# Patient Record
Sex: Male | Born: 1980 | Race: Black or African American | Hispanic: No | Marital: Married | Smoking: Current every day smoker
Health system: Southern US, Community
[De-identification: ages and names within clinical notes are randomized; demographics above are authoritative.]

---

## 2005-09-11 ENCOUNTER — Emergency Department: Payer: Self-pay | Admitting: Unknown Physician Specialty

## 2009-05-18 ENCOUNTER — Emergency Department: Payer: Self-pay | Admitting: Unknown Physician Specialty

## 2010-02-08 ENCOUNTER — Emergency Department (HOSPITAL_COMMUNITY): Admission: EM | Admit: 2010-02-08 | Discharge: 2010-02-08 | Payer: Self-pay | Admitting: Family Medicine

## 2012-06-28 ENCOUNTER — Emergency Department: Payer: Self-pay | Admitting: Emergency Medicine

## 2014-02-16 ENCOUNTER — Emergency Department: Payer: Self-pay | Admitting: Emergency Medicine

## 2014-08-28 ENCOUNTER — Emergency Department: Payer: Self-pay | Admitting: Emergency Medicine

## 2014-09-16 ENCOUNTER — Emergency Department
Admit: 2014-09-16 | Disposition: A | Discharge status: Home / Self Care | Payer: Self-pay | Admitting: Emergency Medicine

## 2015-02-01 ENCOUNTER — Emergency Department
Admission: EM | Admit: 2015-02-01 | Discharge: 2015-02-01 | Disposition: A | Discharge status: Home / Self Care | Payer: Self-pay | Attending: Emergency Medicine | Admitting: Emergency Medicine

## 2015-02-01 ENCOUNTER — Emergency Department: Payer: Self-pay

## 2015-02-01 ENCOUNTER — Encounter: Payer: Self-pay | Admitting: Emergency Medicine

## 2015-02-01 DIAGNOSIS — Z720 Tobacco use: Secondary | ICD-10-CM | POA: Insufficient documentation

## 2015-02-01 DIAGNOSIS — M79672 Pain in left foot: Secondary | ICD-10-CM | POA: Insufficient documentation

## 2015-02-01 MED ORDER — HYDROCODONE-ACETAMINOPHEN 5-325 MG PO TABS: 1.0000 | ORAL_TABLET | ORAL | Status: DC | PRN

## 2015-02-01 MED ORDER — IBUPROFEN 800 MG PO TABS: 800.0000 mg | ORAL_TABLET | Freq: Three times a day (TID) | ORAL | Status: DC | PRN

## 2015-02-01 NOTE — ED Provider Notes (Signed)
[facility] Emergency Department Provider Note  ____________________________________________  Time seen: Approximately 3:18 PM  I have reviewed the triage vital signs and the nursing notes.   HISTORY  Chief Complaint Foot Pain    HPI BRAVE DACK is a 34 y.o. male for evaluation of left foot pain states that he was dancing came down hard on the foot.Planes of pain to the heel only.  History reviewed. No pertinent past medical history.  There are no active problems to display for this patient.   History reviewed. No pertinent past surgical history.  Current Outpatient Rx  Name  Route  Sig  Dispense  Refill  . HYDROcodone-acetaminophen (NORCO) 5-325 MG per tablet   Oral   Take 1-2 tablets by mouth every 4 (four) hours as needed for moderate pain.   6 tablet   0   . ibuprofen (ADVIL,MOTRIN) 800 MG tablet   Oral   Take 1 tablet (800 mg total) by mouth every 8 (eight) hours as needed.   30 tablet   0     Allergies Review of patient's allergies indicates no known allergies.  History reviewed. No pertinent family history.  Social History Social History  Substance Use Topics  . Smoking status: Current Some Day Smoker  . Smokeless tobacco: None  . Alcohol Use: Yes    Review of Systems Constitutional: No fever/chills Eyes: No visual changes. ENT: No sore throat. Cardiovascular: Denies chest pain. Respiratory: Denies shortness of breath. Gastrointestinal: No abdominal pain.  No nausea, no vomiting.  No diarrhea.  No constipation. Genitourinary: Negative for dysuria. Musculoskeletal: As a for left calcaneal pain Skin: Negative for rash. Neurological: Negative for headaches, focal weakness or numbness.  10-point ROS otherwise negative.  ____________________________________________   PHYSICAL EXAM:  VITAL SIGNS: ED Triage Vitals  Enc Vitals Group     BP 02/01/15 1510 120/83 mmHg     Pulse Rate 02/01/15 1510 104     Resp 02/01/15  1510 20     Temp 02/01/15 1510 98.2 F (36.8 C)     Temp Source 02/01/15 1510 Oral     SpO2 02/01/15 1510 99 %     Weight 02/01/15 1510 130 lb (58.968 kg)     Height 02/01/15 1510  (1.778 m)     Head Cir --      Peak Flow --      Pain Score 02/01/15 1511 7     Pain Loc --      Pain Edu? --      Excl. in GC? --     Constitutional: Alert and oriented. Well appearing and in no acute distress. Musculoskeletal: Left calcaneal pain. No erythema edema or ecchymosis noted. Achilles tendon intact. Neurologic:  Normal speech and language. No gross focal neurologic deficits are appreciated. No gait instability. Skin:  Skin is warm, dry and intact. No rash noted. Psychiatric: Mood and affect are normal. Speech and behavior are normal.  ____________________________________________   LABS (all labs ordered are listed, but only abnormal results are displayed)  Labs Reviewed - No data to display   RADIOLOGY  Negative for fracture ____________________________________________   PROCEDURES  Procedure(s) performed: None  Critical Care performed: No  ____________________________________________   INITIAL IMPRESSION / ASSESSMENT AND PLAN / ED COURSE  Pertinent labs & imaging results that were available during my care of the patient were reviewed by me and considered in my medical decision making (see chart for details).  Acute left foot contusion. Rx given  for Motrin 800 mg 3 times a day and hydrocodone No. 8 as needed for severe pain. Work note given for today. Patient discharged home in no other emergency medical complaints at this time ____________________________________________   FINAL CLINICAL IMPRESSION(S) / ED DIAGNOSES  Final diagnoses:  Foot pain, left      Evangeline Dakin, PA-C 02/01/15 1610  Phineas Semen, MD 02/01/15 [phone]

## 2015-02-01 NOTE — Discharge Instructions (Signed)
Ice and elevation for the next 24 hours.

## 2015-02-01 NOTE — ED Notes (Signed)
Patient to ED with c/o left foot pain, patient reports he was dancing and came down hard on that foot.

## 2015-02-27 ENCOUNTER — Emergency Department
Admission: EM | Admit: 2015-02-27 | Discharge: 2015-02-27 | Disposition: A | Discharge status: Home / Self Care | Payer: Self-pay | Attending: Emergency Medicine | Admitting: Emergency Medicine

## 2015-02-27 ENCOUNTER — Encounter: Payer: Self-pay | Admitting: Emergency Medicine

## 2015-02-27 DIAGNOSIS — Z720 Tobacco use: Secondary | ICD-10-CM | POA: Insufficient documentation

## 2015-02-27 DIAGNOSIS — M722 Plantar fascial fibromatosis: Secondary | ICD-10-CM | POA: Insufficient documentation

## 2015-02-27 MED ORDER — IBUPROFEN 800 MG PO TABS: 800.0000 mg | ORAL_TABLET | Freq: Three times a day (TID) | ORAL | Status: AC | PRN

## 2015-02-27 NOTE — Discharge Instructions (Signed)
Plantar Fasciitis  Plantar fasciitis is a common condition that causes foot pain. It is soreness (inflammation) of the band of tough fibrous tissue on the bottom of the foot that runs from the heel bone (calcaneus) to the ball of the foot. The cause of this soreness may be from excessive standing, poor fitting shoes, running on hard surfaces, being overweight, having an abnormal walk, or overuse (this is common in runners) of the painful foot or feet. It is also common in aerobic exercise dancers and ballet dancers.  SYMPTOMS   Most people with plantar fasciitis complain of:   Severe pain in the morning on the bottom of their foot especially when taking the first steps out of bed. This pain recedes after a few minutes of walking.   Severe pain is experienced also during walking following a long period of inactivity.   Pain is worse when walking barefoot or up stairs  DIAGNOSIS    Your caregiver will diagnose this condition by examining and feeling your foot.   Special tests such as X-rays of your foot, are usually not needed.  PREVENTION    Consult a sports medicine professional before beginning a new exercise program.   Walking programs offer a good workout. With walking there is a lower chance of overuse injuries common to runners. There is less impact and less jarring of the joints.   Begin all new exercise programs slowly. If problems or pain develop, decrease the amount of time or distance until you are at a comfortable level.   Wear good shoes and replace them regularly.   Stretch your foot and the heel cords at the back of the ankle (Achilles tendon) both before and after exercise.   Run or exercise on even surfaces that are not hard. For example, asphalt is better than pavement.   Do not run barefoot on hard surfaces.   If using a treadmill, vary the incline.   Do not continue to workout if you have foot or joint problems. Seek professional help if they do not improve.  HOME CARE INSTRUCTIONS     Avoid activities that cause you pain until you recover.   Use ice or cold packs on the problem or painful areas after working out.   Only take over-the-counter or prescription medicines for pain, discomfort, or fever as directed by your caregiver.   Soft shoe inserts or athletic shoes with air or gel sole cushions may be helpful.   If problems continue or become more severe, consult a sports medicine caregiver or your own health care provider. Cortisone is a potent anti-inflammatory medication that may be injected into the painful area. You can discuss this treatment with your caregiver.  MAKE SURE YOU:    Understand these instructions.   Will watch your condition.   Will get help right away if you are not doing well or get worse.  Document Released: 02/26/2001 Document Revised: 08/26/2011 Document Reviewed: 04/27/2008  ExitCare Patient Information 2015 [employer]. This information is not intended to replace advice given to you by your health care provider. Make sure you discuss any questions you have with your health care provider.

## 2015-02-27 NOTE — ED Provider Notes (Signed)
[facility] Emergency Department Provider Note  ____________________________________________  Time seen: Approximately 10:35 AM  I have reviewed the triage vital signs and the nursing notes.   HISTORY  Chief Complaint Foot Pain    HPI Carlos Evans is a 34 y.o. male presents for evaluation of left foot pain but states it better. States he did not go to work Thursday or Friday of last week secondary to the pain which is improved today.   History reviewed. No pertinent past medical history.  There are no active problems to display for this patient.   History reviewed. No pertinent past surgical history.  Current Outpatient Rx  Name  Route  Sig  Dispense  Refill  . ibuprofen (ADVIL,MOTRIN) 800 MG tablet   Oral   Take 1 tablet (800 mg total) by mouth every 8 (eight) hours as needed.   30 tablet   0     Allergies Review of patient's allergies indicates no known allergies.  History reviewed. No pertinent family history.  Social History Social History  Substance Use Topics  . Smoking status: Current Some Day Smoker  . Smokeless tobacco: None  . Alcohol Use: Yes    Review of Systems Constitutional: No fever/chills Eyes: No visual changes. ENT: No sore throat. Cardiovascular: Denies chest pain. Respiratory: Denies shortness of breath. Gastrointestinal: No abdominal pain.  No nausea, no vomiting.  No diarrhea.  No constipation. Genitourinary: Negative for dysuria. Musculoskeletal: Negative for back pain. Skin: Negative for rash. Neurological: Negative for headaches, focal weakness or numbness.  10-point ROS otherwise negative.  ____________________________________________   PHYSICAL EXAM:  VITAL SIGNS: ED Triage Vitals  Enc Vitals Group     BP 02/27/15 1006 116/75 mmHg     Pulse Rate 02/27/15 1006 110     Resp 02/27/15 1006 16     Temp 02/27/15 1006 98.4 F (36.9 C)     Temp Source 02/27/15 1006 Oral     SpO2 02/27/15 1006 100  %     Weight 02/27/15 1006 130 lb (58.968 kg)     Height 02/27/15 1006  (1.778 m)     Head Cir --      Peak Flow --      Pain Score 02/27/15 1007 2     Pain Loc --      Pain Edu? --      Excl. in GC? --    Constitutional: Alert and oriented. Well appearing and in no acute distress.  Musculoskeletal: No lower extremity tenderness nor edema.  No joint effusions. Neurologic:  Normal speech and language. No gross focal neurologic deficits are appreciated. No gait instability. Skin:  Skin is warm, dry and intact. No rash noted. Psychiatric: Mood and affect are normal. Speech and behavior are normal.  ____________________________________________   LABS (all labs ordered are listed, but only abnormal results are displayed)  Labs Reviewed - No data to display ____________________________________________  PROCEDURES  Procedure(s) performed: None  Critical Care performed: No  ____________________________________________   INITIAL IMPRESSION / ASSESSMENT AND PLAN / ED COURSE  Pertinent labs & imaging results that were available during my care of the patient were reviewed by me and considered in my medical decision making (see chart for details).  Recurrent left foot pain which is resolving better today. Rx given for Motrin 800 mg 3 times a day #30 reassurance provided patient given work note and he is to follow-up with his PCP or return to the ER with any worsening symptomology.  Patient voices no other emergency medical complaints at this visit. ____________________________________________   FINAL CLINICAL IMPRESSION(S) / ED DIAGNOSES  Final diagnoses:  Plantar fasciitis of left foot      Evangeline Dakin, PA-C 02/27/15 1054  Jennye Moccasin, MD 02/27/15 1209

## 2015-02-27 NOTE — ED Notes (Signed)
States he was seen about 1 month ago for foot pain   .Marland Kitchenstates foot pain is better but was not able to work on Thursday or Friday. Needs a work note

## 2015-02-27 NOTE — ED Notes (Addendum)
Reports left foot pain x 1 month.  Seen then and dx with contusion.  Pt states pain is mostly better.  Ambulates without difficulty to triage.  States he needs a doctors note to go back to work

## 2015-09-20 ENCOUNTER — Encounter: Payer: Self-pay | Admitting: Emergency Medicine

## 2015-09-20 ENCOUNTER — Emergency Department
Admission: EM | Admit: 2015-09-20 | Discharge: 2015-09-20 | Disposition: A | Discharge status: Home / Self Care | Payer: Self-pay | Attending: Emergency Medicine | Admitting: Emergency Medicine

## 2015-09-20 DIAGNOSIS — S30862A Insect bite (nonvenomous) of penis, initial encounter: Secondary | ICD-10-CM | POA: Insufficient documentation

## 2015-09-20 DIAGNOSIS — F17200 Nicotine dependence, unspecified, uncomplicated: Secondary | ICD-10-CM | POA: Insufficient documentation

## 2015-09-20 DIAGNOSIS — Y939 Activity, unspecified: Secondary | ICD-10-CM | POA: Insufficient documentation

## 2015-09-20 DIAGNOSIS — Y929 Unspecified place or not applicable: Secondary | ICD-10-CM | POA: Insufficient documentation

## 2015-09-20 DIAGNOSIS — W57XXXA Bitten or stung by nonvenomous insect and other nonvenomous arthropods, initial encounter: Secondary | ICD-10-CM | POA: Insufficient documentation

## 2015-09-20 DIAGNOSIS — Y999 Unspecified external cause status: Secondary | ICD-10-CM | POA: Insufficient documentation

## 2015-09-20 MED ORDER — SULFAMETHOXAZOLE-TRIMETHOPRIM 800-160 MG PO TABS: 1.0000 | ORAL_TABLET | Freq: Two times a day (BID) | ORAL | Status: DC

## 2015-09-20 MED ORDER — NEOMYCIN-POLYMYXIN-PRAMOXINE 1 % EX CREA: TOPICAL_CREAM | Freq: Two times a day (BID) | CUTANEOUS | Status: DC

## 2015-09-20 MED ORDER — TRAMADOL HCL 50 MG PO TABS: 50.0000 mg | ORAL_TABLET | Freq: Four times a day (QID) | ORAL | Status: AC | PRN

## 2015-09-20 NOTE — ED Notes (Signed)
See triage   Noticed bumps on penis couple of days ago  Denies any urinary sx's

## 2015-09-20 NOTE — ED Notes (Signed)
Patient presents to the ED with painful bumps to the head of patient's penis.  Patient states one of the bumps is oozing puss.  Patient denies fever.  Patient is in no obvious distress at this time.  Patient first noticed area on Sunday.  Patient denies history of the same.

## 2015-09-20 NOTE — ED Provider Notes (Signed)
[facility] Emergency Department Provider Note  ____________________________________________  Time seen: Approximately 4:04 PM  I have reviewed the triage vital signs and the nursing notes.   HISTORY  Chief Complaint Penis Pain    HPI Carlos Evans is a 35 y.o. male patient complain bumps on penis. 3 days. Patient state purulent material from one to the lesions. Patient denies any fevers chills associated this complaint. Patient denies any high risk sexual activities. Patient significant other state that she smashed insect that she notices crawling on his stomach days ago. Patient denies any dysuria or penial discharge. Patient state there is no history of sexually transmitted disease. Patient rates his pain as a 10 over 10. Patient states pain increase because the area is rubbed against his). No palliative measures taken for this complaint..  History reviewed. No pertinent past medical history.  There are no active problems to display for this patient.   No past surgical history on file.  Current Outpatient Rx  Name  Route  Sig  Dispense  Refill  . ibuprofen (ADVIL,MOTRIN) 800 MG tablet   Oral   Take 1 tablet (800 mg total) by mouth every 8 (eight) hours as needed.   30 tablet   0     Allergies Review of patient's allergies indicates no known allergies.  No family history on file.  Social History Social History  Substance Use Topics  . Smoking status: Current Some Day Smoker  . Smokeless tobacco: None  . Alcohol Use: Yes    Review of Systems Constitutional: No fever/chills Eyes: No visual changes. ENT: No sore throat. Cardiovascular: Denies chest pain. Respiratory: Denies shortness of breath. Gastrointestinal: No abdominal pain.  No nausea, no vomiting.  No diarrhea.  No constipation. Genitourinary: Negative for dysuria. Musculoskeletal: Negative for back pain. Skin: Positive for rash. Neurological: Negative for headaches, focal  weakness or numbness.    ____________________________________________   PHYSICAL EXAM:  VITAL SIGNS: ED Triage Vitals  Enc Vitals Group     BP 09/20/15 1523 117/73 mmHg     Pulse Rate 09/20/15 1523 100     Resp 09/20/15 1523 20     Temp 09/20/15 1523 98.4 F (36.9 C)     Temp Source 09/20/15 1523 Oral     SpO2 09/20/15 1523 98 %     Weight 09/20/15 1523 124 lb (56.246 kg)     Height 09/20/15 1523  (1.778 m)     Head Cir --      Peak Flow --      Pain Score 09/20/15 1524 10     Pain Loc --      Pain Edu? --      Excl. in GC? --     Constitutional: Alert and oriented. Well appearing and in no acute distress. Eyes: Conjunctivae are normal. PERRL. EOMI. Head: Atraumatic. Nose: No congestion/rhinnorhea. Mouth/Throat: Mucous membranes are moist.  Oropharynx non-erythematous. Neck: No stridor.  No cervical spine tenderness to palpation. Cardiovascular: Normal rate, regular rhythm. Grossly normal heart sounds.  Good peripheral circulation. Respiratory: Normal respiratory effort.  No retractions. Lungs CTAB. Gastrointestinal: Soft and nontender. No distention. No abdominal bruits. No CVA tenderness. Genitourinary: To ruptured papular lesions meatus of penis. Musculoskeletal: No lower extremity tenderness nor edema.  No joint effusions. Neurologic:  Normal speech and language. No gross focal neurologic deficits are appreciated. No gait instability. Skin:  Skin is warm, dry and intact. No rash noted. Psychiatric: Mood and affect are normal. Speech and behavior  are normal.  ____________________________________________   LABS (all labs ordered are listed, but only abnormal results are displayed)  Labs Reviewed  WOUND CULTURE   ____________________________________________  EKG   ____________________________________________  RADIOLOGY   ____________________________________________   PROCEDURES  Procedure(s) performed: None  Critical Care performed:  No  ____________________________________________   INITIAL IMPRESSION / ASSESSMENT AND PLAN / ED COURSE  Pertinent labs & imaging results that were available during my care of the patient were reviewed by me and considered in my medical decision making (see chart for details).  Infected insect bite penis. Cultures taken of the discharge. Patient given a prescription for Bactrim DS, tramadol, and Neosporin. Patient advised to [facility] if condition does not improve in 5-7 days. ____________________________________________   FINAL CLINICAL IMPRESSION(S) / ED DIAGNOSES  Final diagnoses:  Infected insect bite       Joni ReiningRonald K Smith, PA-C 09/20/15 1610  Emily FilbertJonathan E Williams, MD 09/21/15 1515

## 2015-09-20 NOTE — Discharge Instructions (Signed)
Take medication as directed.

## 2015-09-23 LAB — WOUND CULTURE: SPECIAL REQUESTS: NORMAL

## 2016-04-06 IMAGING — CR DG FOOT COMPLETE 3+V*L*
1 series · 3 of 3 positions shown · non-contrast
Comparison: None.

CLINICAL DATA: Jumping injury, landing on foot wrong. Bruise on
bottom center of foot. Pain around heel.

EXAM:
LEFT FOOT - COMPLETE 3+ VIEW

[Series 1: ap · 0.17mm/px · 3 of 3 slices shown]
[im 1/3]
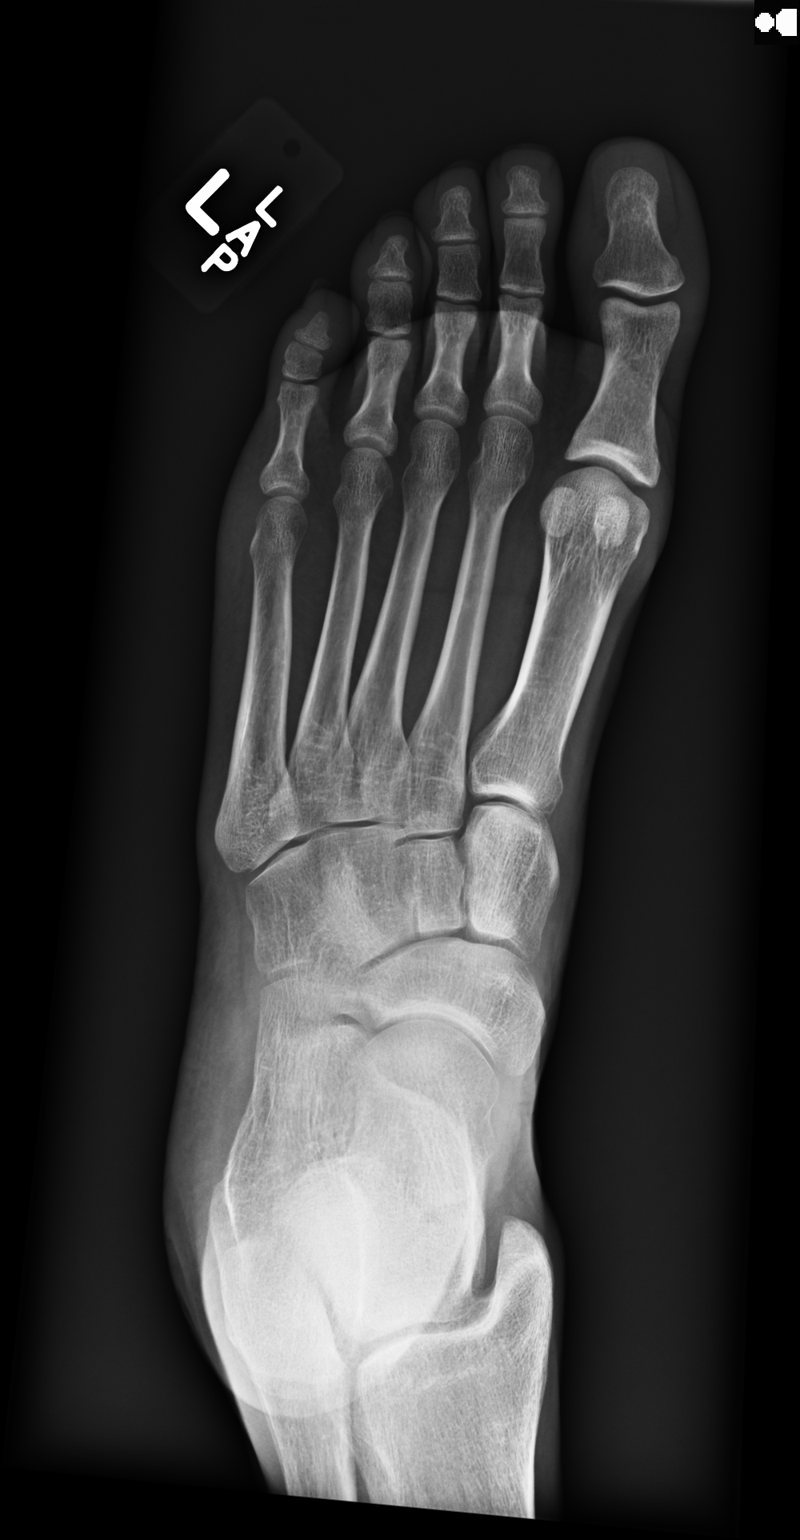
[im 2/3]
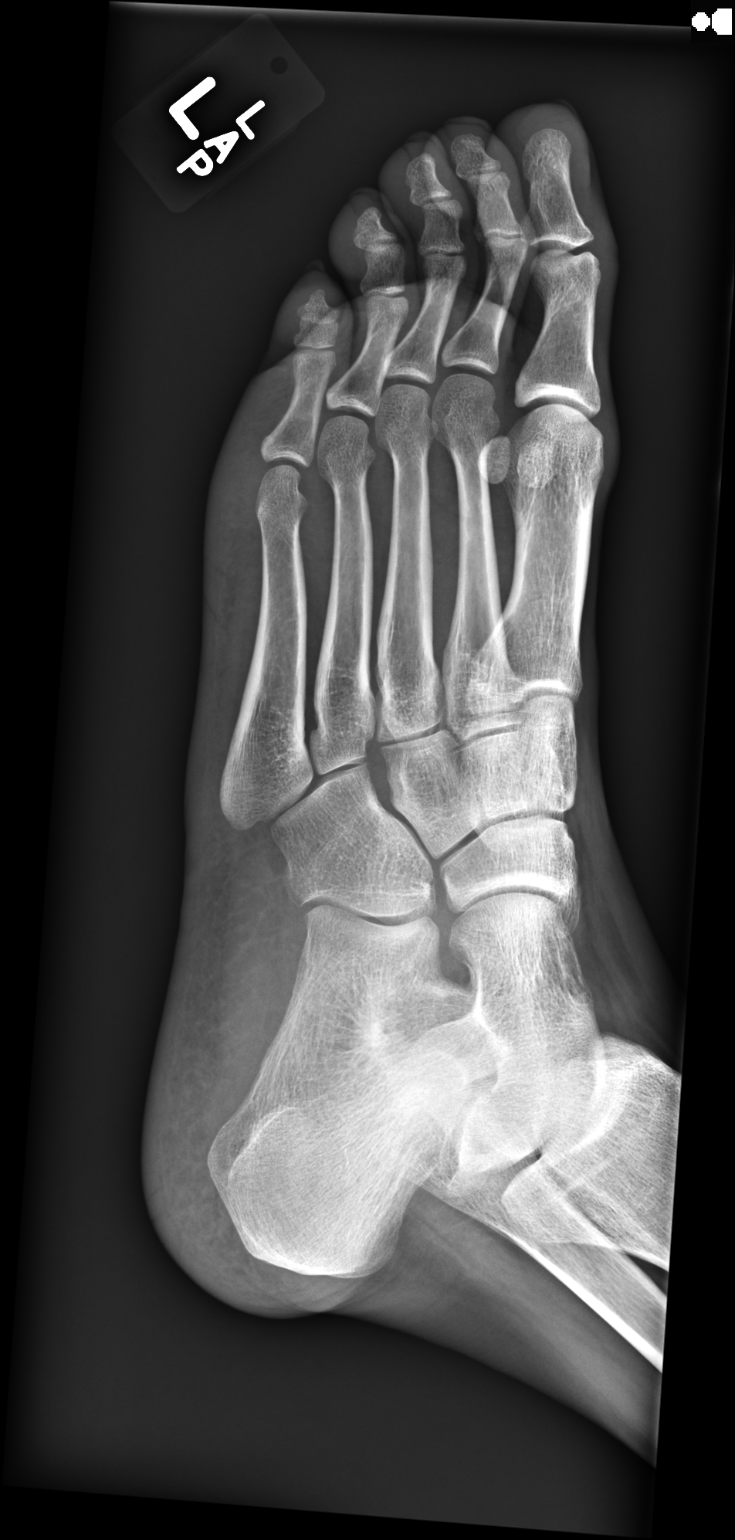
[im 3/3]
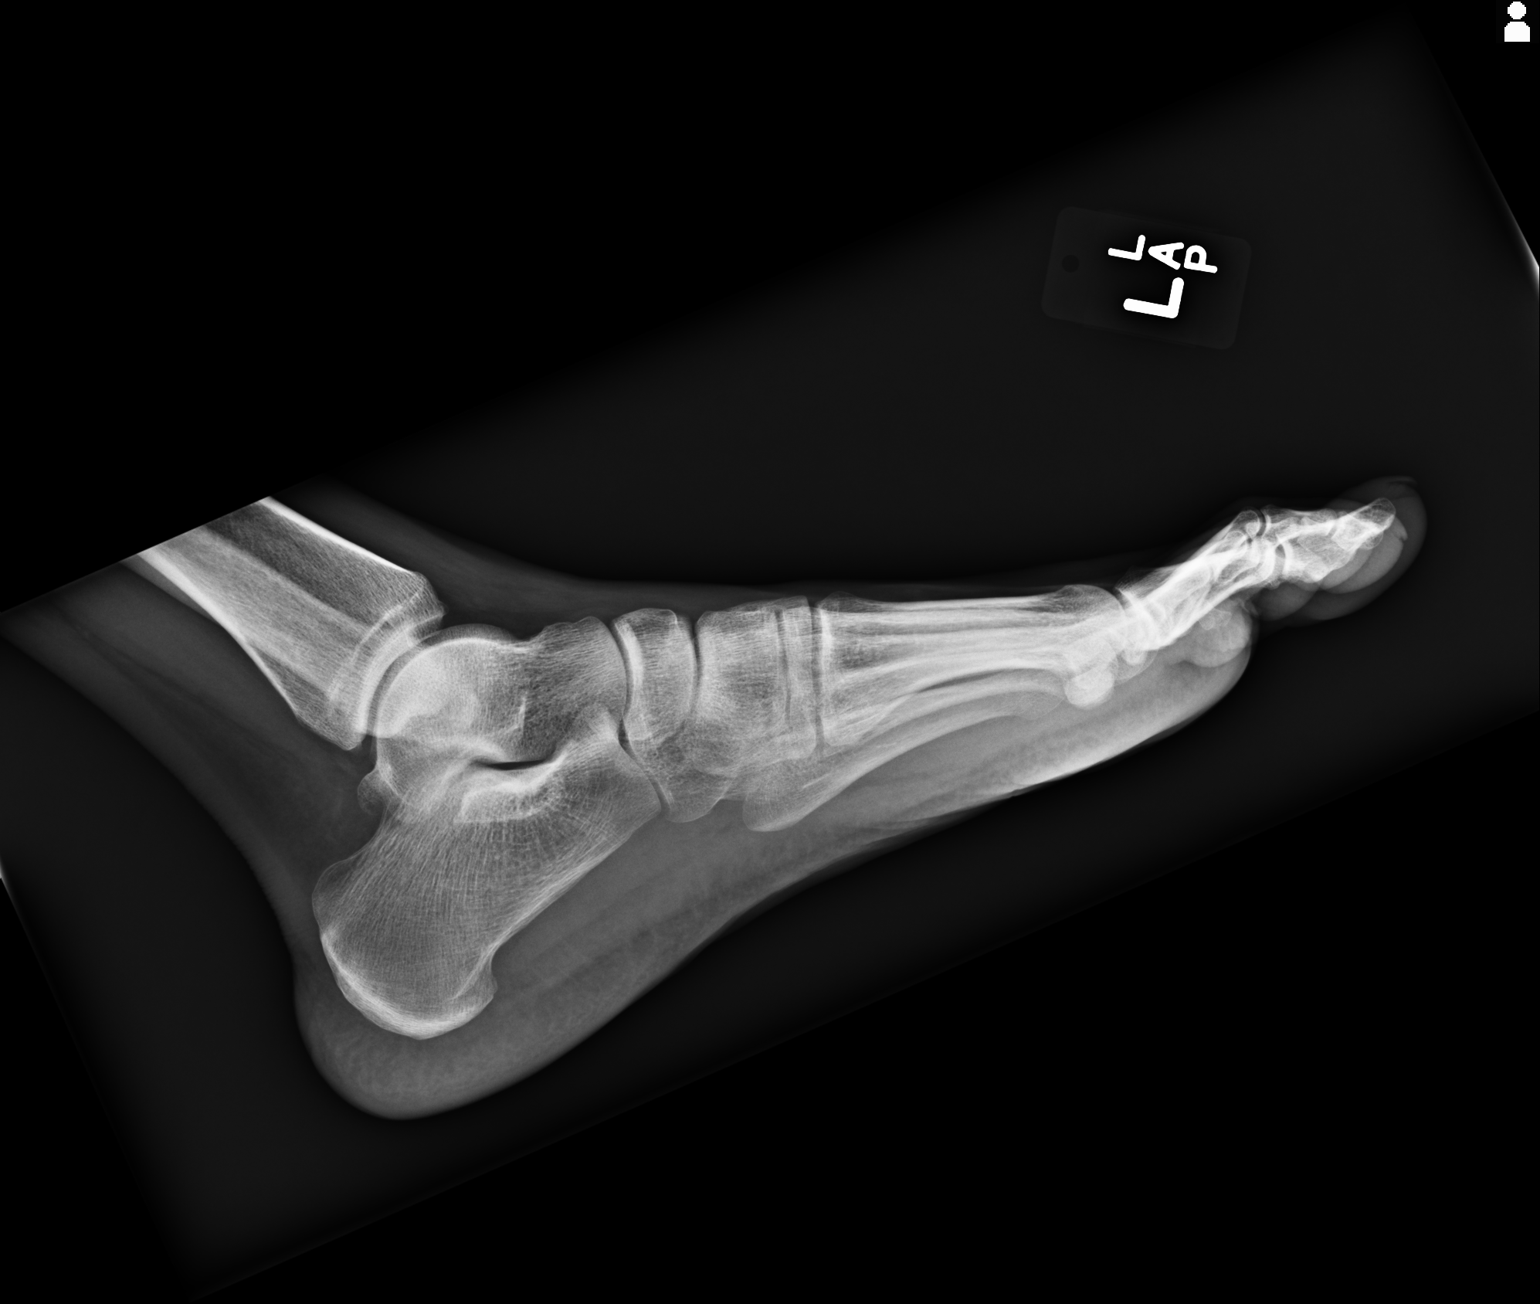

[3 of 3 positions shown; findings below may reference images not displayed]

FINDINGS: There is no evidence of fracture or dislocation. There is no
evidence of arthropathy or other focal bone abnormality. Soft
tissues are unremarkable.
IMPRESSION: Negative.

## 2017-02-07 ENCOUNTER — Emergency Department
Admission: EM | Admit: 2017-02-07 | Discharge: 2017-02-07 | Disposition: A | Discharge status: Home / Self Care | Payer: Self-pay | Attending: Emergency Medicine | Admitting: Emergency Medicine

## 2017-02-07 DIAGNOSIS — S81811A Laceration without foreign body, right lower leg, initial encounter: Secondary | ICD-10-CM | POA: Insufficient documentation

## 2017-02-07 DIAGNOSIS — Y9367 Activity, basketball: Secondary | ICD-10-CM | POA: Insufficient documentation

## 2017-02-07 DIAGNOSIS — F17200 Nicotine dependence, unspecified, uncomplicated: Secondary | ICD-10-CM | POA: Insufficient documentation

## 2017-02-07 DIAGNOSIS — W010XXA Fall on same level from slipping, tripping and stumbling without subsequent striking against object, initial encounter: Secondary | ICD-10-CM | POA: Insufficient documentation

## 2017-02-07 DIAGNOSIS — Y92310 Basketball court as the place of occurrence of the external cause: Secondary | ICD-10-CM | POA: Insufficient documentation

## 2017-02-07 DIAGNOSIS — S81812A Laceration without foreign body, left lower leg, initial encounter: Secondary | ICD-10-CM

## 2017-02-07 DIAGNOSIS — Y999 Unspecified external cause status: Secondary | ICD-10-CM | POA: Insufficient documentation

## 2017-02-07 MED ORDER — NAPROXEN 500 MG PO TABS: 500.0000 mg | ORAL_TABLET | Freq: Two times a day (BID) | ORAL | 0 refills | Status: AC

## 2017-02-07 MED ORDER — BACITRACIN ZINC 500 UNIT/GM EX OINT
TOPICAL_OINTMENT | Freq: Two times a day (BID) | CUTANEOUS | Status: DC
  Administered 2017-02-07: 1 via TOPICAL
  Filled 2017-02-07: qty 0.9

## 2017-02-07 MED ORDER — SULFAMETHOXAZOLE-TRIMETHOPRIM 800-160 MG PO TABS
ORAL_TABLET | ORAL | Status: AC
  Filled 2017-02-07: qty 1

## 2017-02-07 MED ORDER — SULFAMETHOXAZOLE-TRIMETHOPRIM 800-160 MG PO TABS: 1.0000 | ORAL_TABLET | Freq: Two times a day (BID) | ORAL | 0 refills | Status: AC

## 2017-02-07 MED ORDER — SULFAMETHOXAZOLE-TRIMETHOPRIM 800-160 MG PO TABS
1.0000 | ORAL_TABLET | Freq: Once | ORAL | Status: AC
  Administered 2017-02-07: 1 via ORAL

## 2017-02-07 NOTE — ED Triage Notes (Addendum)
Pt presents to ED via POV with c/o laceration to medical aspect of RIGHT knee s/p fall on Wednesday. Pt reports injury occurred while playing basketball. Pt has a 1" long by 1/2" wide laceration, no bleeding at this time. No obvious deformity or dislocation to knee observed, distal CMS intact. Pt is A&O, in NAD; RR even, regular, and unlabored. Skin color/temp is otherwise WNL.

## 2017-02-07 NOTE — ED Notes (Signed)
Cleaned wound with sterile saline, applied bacitracin and sterile bandage per NP order.

## 2017-02-07 NOTE — ED Notes (Signed)
Patient c/o wound/shave laceration to right medial leg, proximal leg that occurred on Wednesday. Patient is tender to palpation of surrounding area, extending approx 4" distal from site of injury, as well as to to posterior knee.

## 2017-02-07 NOTE — Discharge Instructions (Signed)
Return to the ER for symptoms of concern if you are unable to schedule an appointment with your primary care provider. Clean the wound 2 times per day and use antibiotic ointment. Leave it open to air when not at risk of getting dirty. Cover it before bed and before work. Take the antibiotic until finished.

## 2017-02-07 NOTE — ED Provider Notes (Signed)
[facility] Emergency Department Provider Note  ____________________________________________  Time seen: Approximately 11:15 PM  I have reviewed the triage vital signs and the nursing notes.   HISTORY  Chief Complaint Laceration   HPI Carlos Evans is a 36 y.o. male who presents to the emergency department for treatment of an open wound to his rightlower extremity that he sustained while playing basketball 2 days ago. He has not attempted any wound care. The area has become more tender today and caused him to become concerned enough to come to the ER. He denies fever. He has not taken any medications or attempted any alleviating measures with the exception of elevation and rest.  History reviewed. No pertinent past medical history.  There are no active problems to display for this patient.   History reviewed. No pertinent surgical history.  Prior to Admission medications   Medication Sig Start Date End Date Taking? Authorizing Provider  ibuprofen (ADVIL,MOTRIN) 800 MG tablet Take 1 tablet (800 mg total) by mouth every 8 (eight) hours as needed. 02/27/15   Beers, Charmayne Sheer, PA-C  naproxen (NAPROSYN) 500 MG tablet Take 1 tablet (500 mg total) by mouth 2 (two) times daily with a meal. 02/07/17   Zenovia Justman B, FNP  sulfamethoxazole-trimethoprim (BACTRIM DS,SEPTRA DS) 800-160 MG tablet Take 1 tablet by mouth 2 (two) times daily. 02/07/17   Evana Runnels, Rulon Eisenmenger B, FNP  traMADol (ULTRAM) 50 MG tablet Take 1 tablet (50 mg total) by mouth every 6 (six) hours as needed for moderate pain. 09/20/15   Joni Reining, PA-C    Allergies Patient has no known allergies.  No family history on file.  Social History Social History  Substance Use Topics  . Smoking status: Current Some Day Smoker  . Smokeless tobacco: Never Used  . Alcohol use Yes    Review of Systems  Constitutional: Negative  for fever. Respiratory: Negative for cough or shortness of breath.   Musculoskeletal: Positive for myalgias Skin: Positive for laceration. Neurological: Negative for numbness or paresthesias. ____________________________________________   PHYSICAL EXAM:  VITAL SIGNS: ED Triage Vitals  Enc Vitals Group     BP 02/07/17 2006 112/71     Pulse Rate 02/07/17 2006 88     Resp 02/07/17 2006 17     Temp 02/07/17 2006 98.6 F (37 C)     Temp Source 02/07/17 2006 Oral     SpO2 02/07/17 2006 99 %     Weight 02/07/17 2001 145 lb (65.8 kg)     Height 02/07/17 2001 5\' 10"  (1.778 m)     Head Circumference --      Peak Flow --      Pain Score 02/07/17 2001 10     Pain Loc --      Pain Edu? --      Excl. in GC? --      Constitutional: Well appearing. Eyes: Conjunctivae are clear without discharge or drainage. Nose: No rhinorrhea noted. Mouth/Throat: Airway is patent.  Neck: No stridor. Unrestricted range of motion observed.  Cardiovascular: Capillary refill is <3 seconds.  Respiratory: Respirations are even and unlabored.. Musculoskeletal: Unrestricted range of motion observed. Neurologic: Awake, alert, and oriented x 4.  Skin:  2.5cm open lesion with granulation tissue noted to the proximal, medial, right lower extremity just below the knee. Surrounding area of erythema  ____________________________________________   LABS (all labs ordered are listed, but only abnormal results are displayed)  Labs Reviewed - No data to display ____________________________________________  EKG   ____________________________________________  RADIOLOGY  Not indicated. ____________________________________________   PROCEDURES  Procedure(s) performed: None ____________________________________________   INITIAL IMPRESSION / ASSESSMENT AND PLAN / ED COURSE  Carlos Evans is a 36 y.o. male who presents to the emergency department 2 days after falling while playing basketball. Wound cleaned with NS and bacitracin dressing applied by RN. He will be treated  with Bactrim for concern of an early cellulitis. Tetanus is up to date per patient. Symptoms and signs of worsening infection, and wound care were discussed with the patient and family. He is to follow up with his PCP for symptoms that are not improving over the next few days or return to the ER if unable to schedule an appointment.   Pertinent labs & imaging results that were available during my care of the patient were reviewed by me and considered in my medical decision making (see chart for details). ____________________________________________   FINAL CLINICAL IMPRESSION(S) / ED DIAGNOSES  Final diagnoses:  Laceration of left lower extremity, initial encounter    Discharge Medication List as of 02/07/2017  9:11 PM    START taking these medications   Details  naproxen (NAPROSYN) 500 MG tablet Take 1 tablet (500 mg total) by mouth 2 (two) times daily with a meal., Starting Fri 02/07/2017, Print        If controlled substance prescribed during this visit, 12 month history viewed on the [facility] prior to issuing an initial prescription for Schedule II or III opiod.   Note:  This document was prepared using Dragon voice recognition software and may include unintentional dictation errors.    Chinita Pester, FNP 02/07/17 7517    Loleta Rose, MD 02/08/17 [phone]

## 2017-08-24 ENCOUNTER — Emergency Department
Admission: EM | Admit: 2017-08-24 | Discharge: 2017-08-24 | Disposition: A | Discharge status: Home / Self Care | Payer: Self-pay | Attending: Emergency Medicine | Admitting: Emergency Medicine

## 2017-08-24 ENCOUNTER — Other Ambulatory Visit: Payer: Self-pay

## 2017-08-24 ENCOUNTER — Encounter: Payer: Self-pay | Admitting: Emergency Medicine

## 2017-08-24 ENCOUNTER — Emergency Department: Payer: Self-pay

## 2017-08-24 DIAGNOSIS — W109XXA Fall (on) (from) unspecified stairs and steps, initial encounter: Secondary | ICD-10-CM | POA: Insufficient documentation

## 2017-08-24 DIAGNOSIS — F17200 Nicotine dependence, unspecified, uncomplicated: Secondary | ICD-10-CM | POA: Insufficient documentation

## 2017-08-24 DIAGNOSIS — Y9389 Activity, other specified: Secondary | ICD-10-CM | POA: Insufficient documentation

## 2017-08-24 DIAGNOSIS — S92414A Nondisplaced fracture of proximal phalanx of right great toe, initial encounter for closed fracture: Secondary | ICD-10-CM | POA: Insufficient documentation

## 2017-08-24 DIAGNOSIS — Y929 Unspecified place or not applicable: Secondary | ICD-10-CM | POA: Insufficient documentation

## 2017-08-24 DIAGNOSIS — Z79899 Other long term (current) drug therapy: Secondary | ICD-10-CM | POA: Insufficient documentation

## 2017-08-24 DIAGNOSIS — Y999 Unspecified external cause status: Secondary | ICD-10-CM | POA: Insufficient documentation

## 2017-08-24 NOTE — ED Notes (Signed)
Swelling and pain to right foot without bruising. Sensation intact, vascular integrity remains.   Swelling to left face/cheek with abrasions scabbed over without obvious signs infection.   Injuries from incident Friday when patient fell down 15 stairs. Denies pain to back or head, no LOC, patient was intoxicated at time

## 2017-08-24 NOTE — ED Notes (Signed)
Pt. Verbalizes understanding of d/c instructions, medications, and follow-up. VS stable.  Pt. In NAD at time of d/c and denies further concerns regarding this visit. Pt. Stable at the time of departure from the unit, departing unit by the safest and most appropriate manner per that pt condition and limitations with all belongings accounted for. Pt advised to return to the ED at any time for emergent concerns, or for new/worsening symptoms.   

## 2017-08-24 NOTE — ED Triage Notes (Signed)
Pt arrives ambulatory to triage with c/o right foot injury. Pt reports that he fell down the steps and hurt his foot. Pt denies LOC or head trauma of any kind and is in NAD.
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
## 2018-09-07 ENCOUNTER — Other Ambulatory Visit: Payer: Self-pay

## 2018-09-07 ENCOUNTER — Emergency Department
Admission: EM | Admit: 2018-09-07 | Discharge: 2018-09-07 | Disposition: A | Discharge status: Home / Self Care | Payer: Self-pay | Attending: Emergency Medicine | Admitting: Emergency Medicine

## 2018-09-07 ENCOUNTER — Encounter: Payer: Self-pay | Admitting: Emergency Medicine

## 2018-09-07 DIAGNOSIS — J069 Acute upper respiratory infection, unspecified: Secondary | ICD-10-CM | POA: Insufficient documentation

## 2018-09-07 DIAGNOSIS — Z79899 Other long term (current) drug therapy: Secondary | ICD-10-CM | POA: Insufficient documentation

## 2018-09-07 DIAGNOSIS — B9789 Other viral agents as the cause of diseases classified elsewhere: Secondary | ICD-10-CM | POA: Insufficient documentation

## 2018-09-07 DIAGNOSIS — F17210 Nicotine dependence, cigarettes, uncomplicated: Secondary | ICD-10-CM | POA: Insufficient documentation

## 2018-09-07 MED ORDER — PROMETHAZINE-DM 6.25-15 MG/5ML PO SYRP
5.0000 mL | ORAL_SOLUTION | Freq: Four times a day (QID) | ORAL | 0 refills | Status: AC | PRN
Start: 2018-09-07 — End: ?

## 2018-09-07 NOTE — ED Triage Notes (Signed)
Cough x3 weeks , sinus congestion

## 2018-09-07 NOTE — ED Provider Notes (Signed)
[facility] Emergency Department Provider Note   ____________________________________________   None    (approximate)  I have reviewed the triage vital signs and the nursing notes.   HISTORY  Chief Complaint Cough    HPI Carlos Evans is a 38 y.o. male patient presents with 3 weeks of a nonproductive cough.  Patient also states sinus congestion.  Patient denies fever chills associated this complaint.  Patient denies nausea, vomiting, diarrhea.  Patient denies recent travel exposed to anyone with recent traveling.  Patient state requests medical clearance return to work.       History reviewed. No pertinent past medical history.  There are no active problems to display for this patient.   History reviewed. No pertinent surgical history.  Prior to Admission medications   Medication Sig Start Date End Date Taking? Authorizing Provider  ibuprofen (ADVIL,MOTRIN) 800 MG tablet Take 1 tablet (800 mg total) by mouth every 8 (eight) hours as needed. 02/27/15   Beers, Charmayne Sheer, PA-C  naproxen (NAPROSYN) 500 MG tablet Take 1 tablet (500 mg total) by mouth 2 (two) times daily with a meal. 02/07/17   Triplett, Cari B, FNP  promethazine-dextromethorphan (PROMETHAZINE-DM) 6.25-15 MG/5ML syrup Take 5 mLs by mouth 4 (four) times daily as needed for cough. 09/07/18   Joni Reining, PA-C  sulfamethoxazole-trimethoprim (BACTRIM DS,SEPTRA DS) 800-160 MG tablet Take 1 tablet by mouth 2 (two) times daily. 02/07/17   Triplett, Rulon Eisenmenger B, FNP  traMADol (ULTRAM) 50 MG tablet Take 1 tablet (50 mg total) by mouth every 6 (six) hours as needed for moderate pain. 09/20/15   Joni Reining, PA-C    Allergies Patient has no known allergies.  No family history on file.  Social History Social History   Tobacco Use  . Smoking status: Current Some Day Smoker  . Smokeless tobacco: Never Used  Substance Use Topics  . Alcohol use: Yes  . Drug use: No    Review of Systems  Constitutional: No fever/chills Eyes: No visual changes. ENT: Sore throat.   Cardiovascular: Denies chest pain. Respiratory: Denies shortness of breath. Gastrointestinal: No abdominal pain.  No nausea, no vomiting.  No diarrhea.  No constipation. Genitourinary: Negative for dysuria. Musculoskeletal: Negative for back pain. Skin: Negative for rash. Neurological: Negative for headaches, focal weakness or numbness.   ____________________________________________   PHYSICAL EXAM:  VITAL SIGNS: ED Triage Vitals  Enc Vitals Group     BP 09/07/18 1233 (!) 154/105     Pulse Rate 09/07/18 1233 77     Resp 09/07/18 1233 18     Temp 09/07/18 1233 99.2 F (37.3 C)     Temp Source 09/07/18 1233 Oral     SpO2 09/07/18 1233 100 %     Weight 09/07/18 1234 142 lb (64.4 kg)     Height 09/07/18 1234 5\' 10"  (1.778 m)     Head Circumference --      Peak Flow --      Pain Score 09/07/18 1234 0     Pain Loc --      Pain Edu? --      Excl. in GC? --     Constitutional: Alert and oriented. Well appearing and in no acute distress. Nose: Clear rhinorrhea.   Mouth/Throat: Mucous membranes are moist.  Oropharynx non-erythematous. Neck: No stridor.  Hematological/Lymphatic/Immunilogical: No cervical lymphadenopathy. Cardiovascular: Normal rate, regular rhythm. Grossly normal heart sounds.  Good peripheral circulation. Respiratory: Normal respiratory effort.  No retractions. Lungs CTAB. Skin:  Skin is warm, dry and intact. No rash noted. Psychiatric: Mood and affect are normal. Speech and behavior are normal.  ____________________________________________   LABS (all labs ordered are listed, but only abnormal results are displayed)  Labs Reviewed - No data to display ____________________________________________  EKG   ____________________________________________  RADIOLOGY  ED MD interpretation:    Official radiology report(s): No results found.   ____________________________________________   PROCEDURES  Procedure(s) performed (including Critical Care):  Procedures   ____________________________________________   INITIAL IMPRESSION / ASSESSMENT AND PLAN / ED COURSE  As part of my medical decision making, I reviewed the following data within the electronic MEDICAL RECORD NUMBER        Patient presents with cough for 3 weeks.  Patient denies fever.  Patient denies dyspnea.  Physical exam consistent with viral respiratory infection.  Patient given discharge care instruction and prescription for Phenergan DM.  Patient advised follow-up [facility] as needed.      ____________________________________________   FINAL CLINICAL IMPRESSION(S) / ED DIAGNOSES  Final diagnoses:  Viral URI with cough     ED Discharge Orders         Ordered    promethazine-dextromethorphan (PROMETHAZINE-DM) 6.25-15 MG/5ML syrup  4 times daily PRN     09/07/18 1243           Note:  This document was prepared using Dragon voice recognition software and may include unintentional dictation errors.    Joni Reining, PA-C 09/07/18 1304    Sharyn Creamer, MD 09/07/18 2219

## 2018-10-28 IMAGING — CR DG FOOT COMPLETE 3+V*R*
3 series · 3 of 3 positions shown · non-contrast
Comparison: None.

CLINICAL DATA: Fall down stairs with foot pain, initial encounter

EXAM:
RIGHT FOOT COMPLETE - 3+ VIEW

[foot ap]
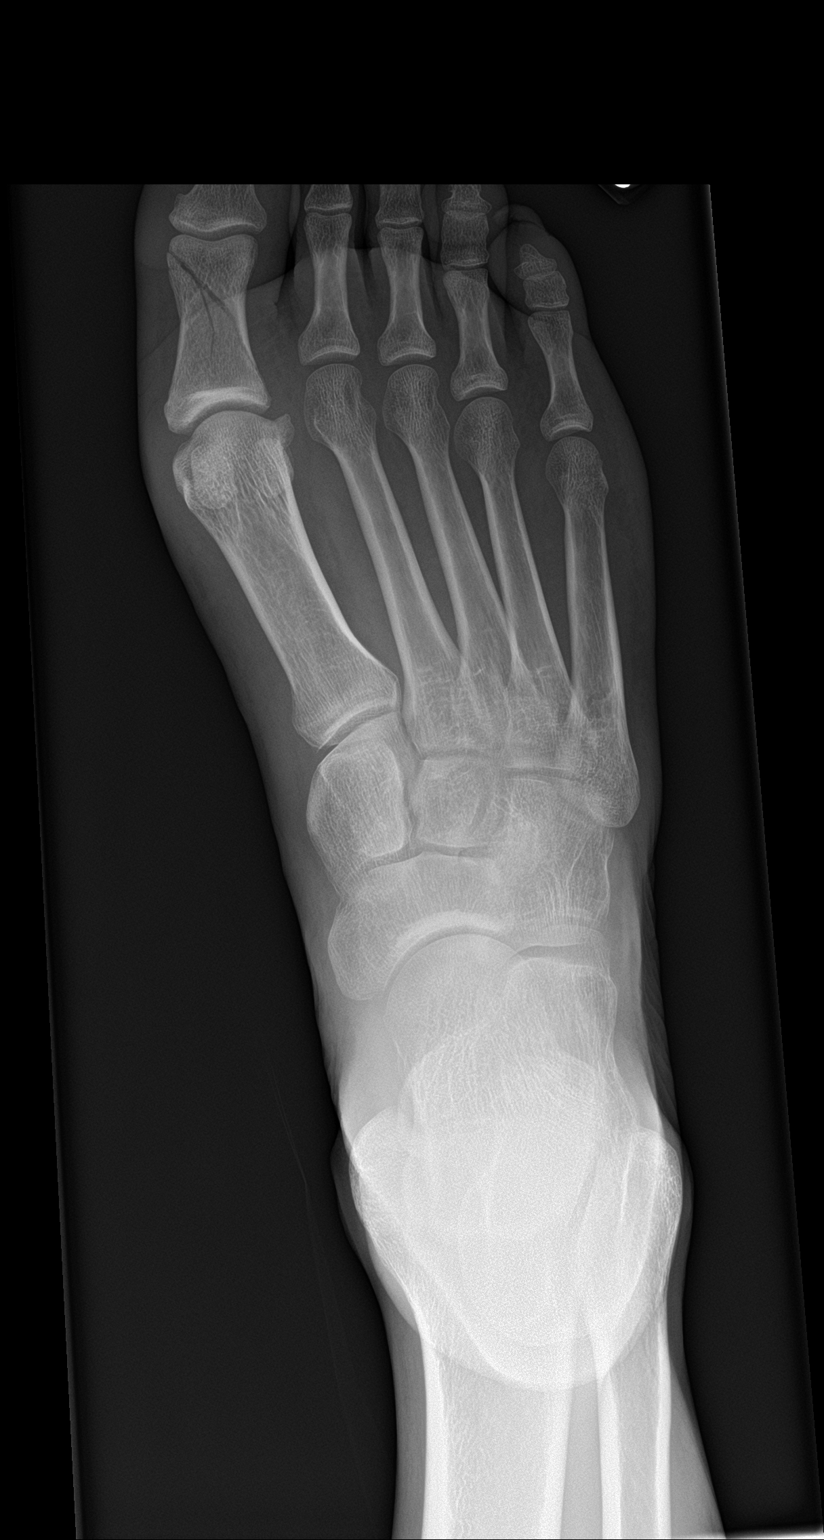

[foot obl]
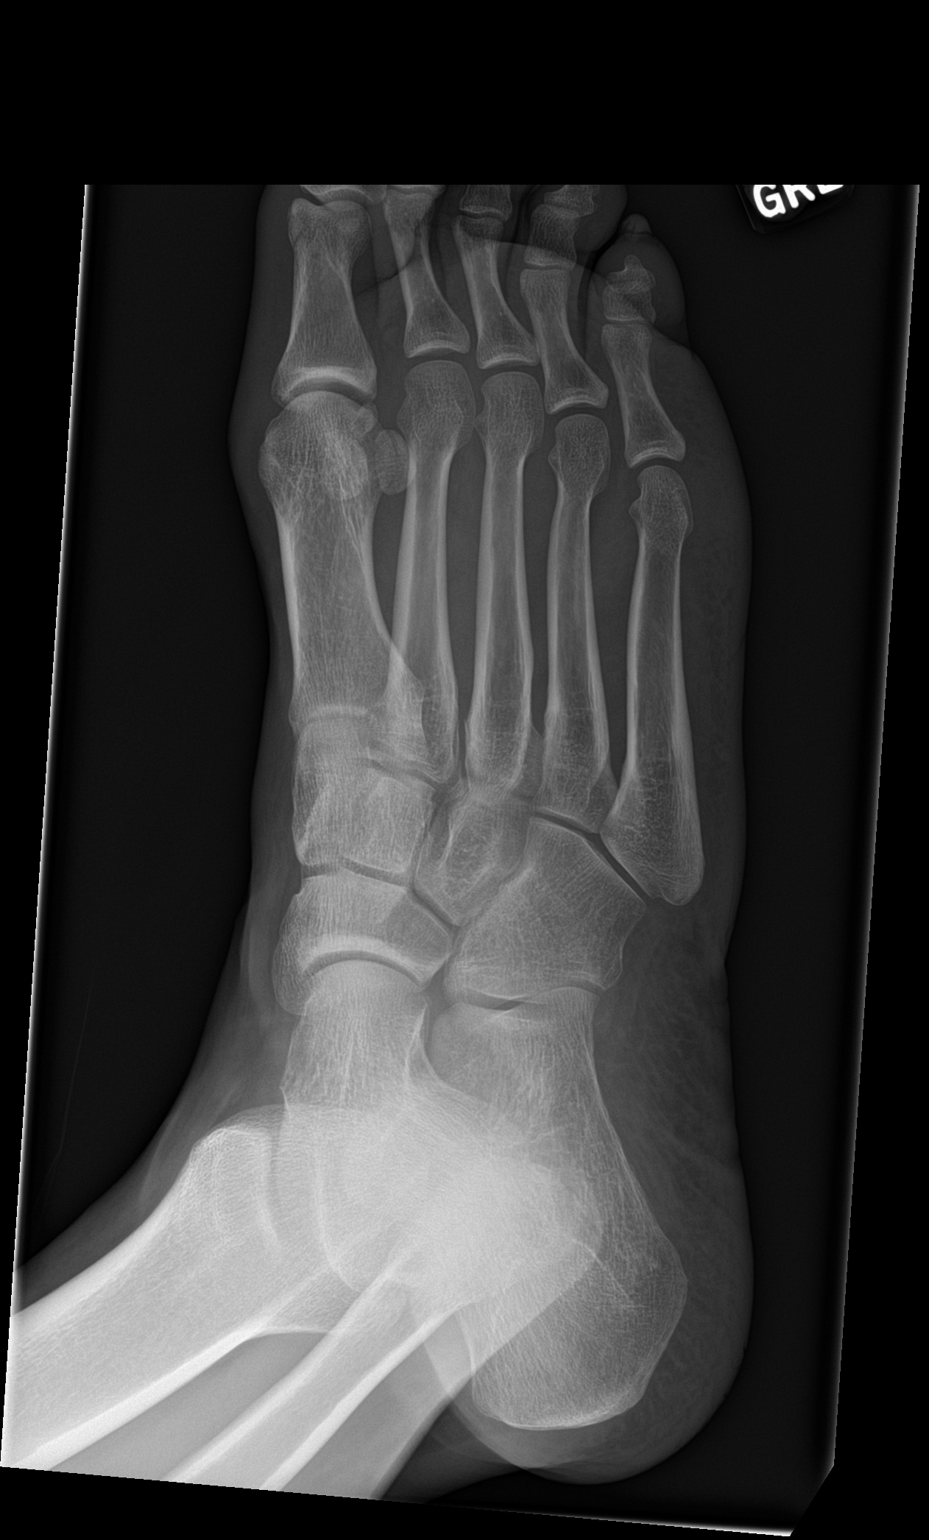

[foot lat]
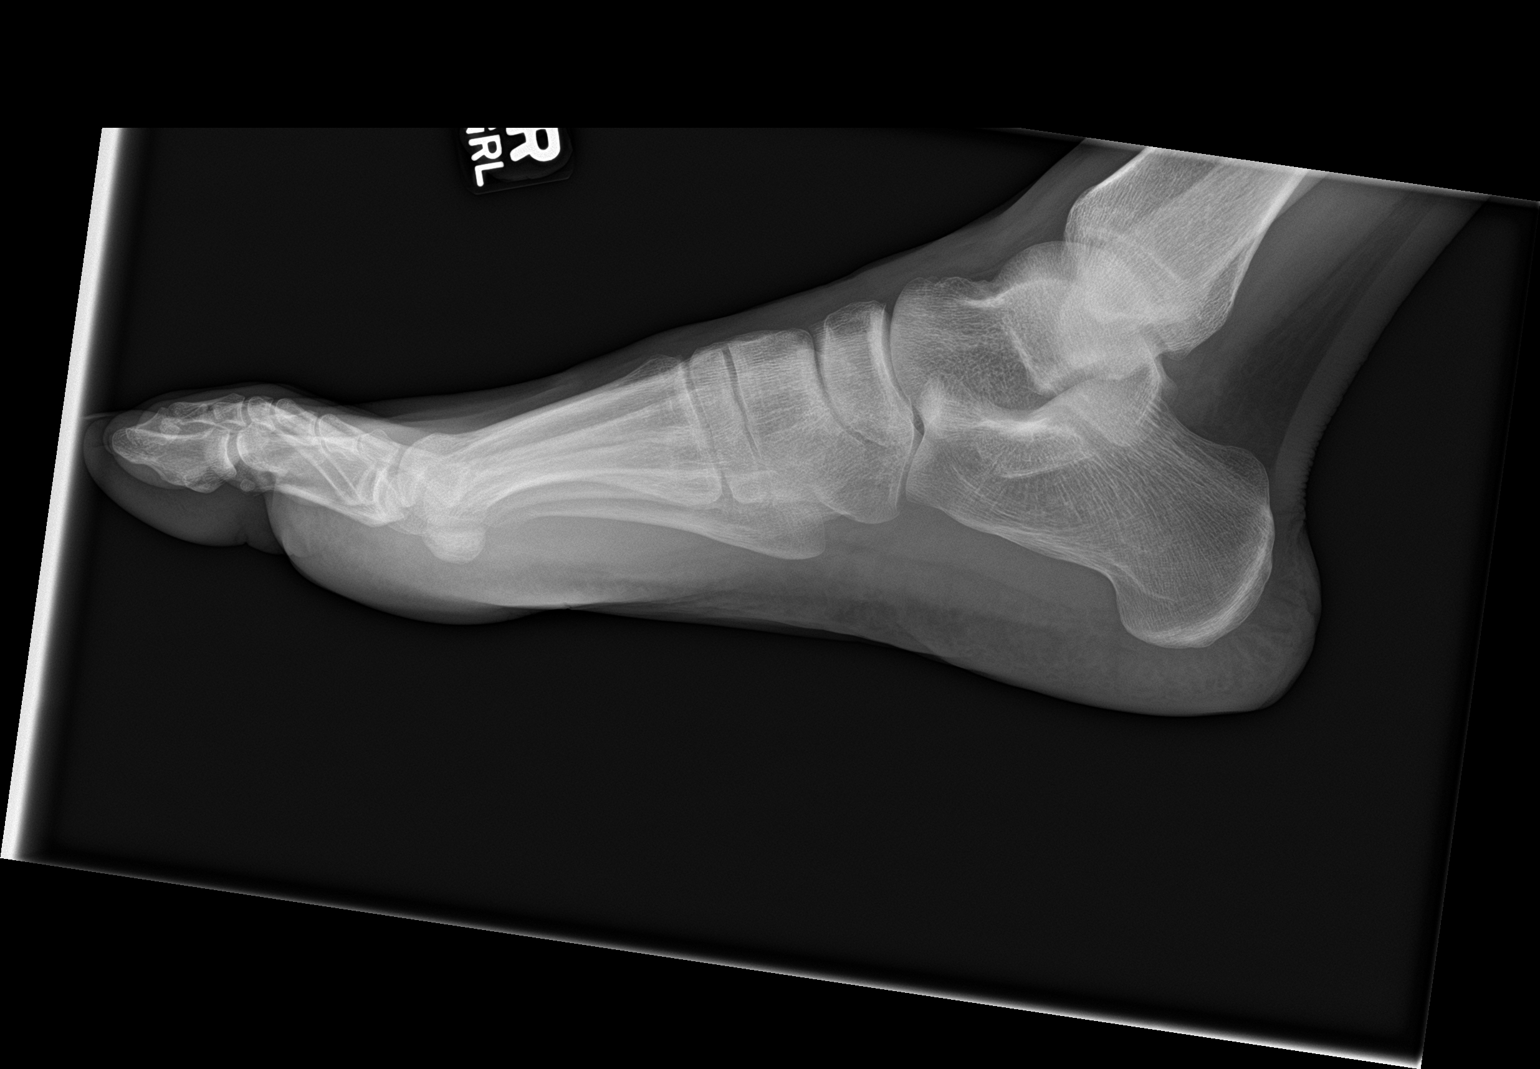

[3 of 3 positions shown; findings below may reference images not displayed]

FINDINGS: There is a comminuted fracture of the first proximal phalanx without
significant displacement. No other fractures are seen. No soft
tissue abnormality is noted.
IMPRESSION: First proximal phalangeal fracture

## 2020-01-24 ENCOUNTER — Ambulatory Visit: Payer: Self-pay | Attending: Internal Medicine

## 2020-01-24 DIAGNOSIS — Z23 Encounter for immunization: Secondary | ICD-10-CM

## 2020-01-24 NOTE — Progress Notes (Signed)
   Covid-19 Vaccination Clinic  Name:  Gabe Glace    MRN: [id number] DOB: [birth year]  01/24/2020  Mr. Milne was observed post Covid-19 immunization for 15 minutes without incident. He was provided with Vaccine Information Sheet and instruction to access the V-Safe system.   Mr. Attia was instructed to call 911 with any severe reactions post vaccine: Marland Kitchen Difficulty breathing  . Swelling of face and throat  . A fast heartbeat  . A bad rash all over body  . Dizziness and weakness   Immunizations Administered    Name Date Dose VIS Date Route   Moderna COVID-19 Vaccine 01/24/2020 11:11 AM 0.5 mL 05/2019 Intramuscular   Manufacturer: Moderna   Lot: [id number]   NDC: 46803-212-24

## 2021-01-21 ENCOUNTER — Other Ambulatory Visit: Payer: Self-pay

## 2021-01-21 DIAGNOSIS — F17200 Nicotine dependence, unspecified, uncomplicated: Secondary | ICD-10-CM | POA: Insufficient documentation

## 2021-01-21 DIAGNOSIS — M545 Low back pain, unspecified: Secondary | ICD-10-CM | POA: Insufficient documentation

## 2021-01-21 DIAGNOSIS — Y990 Civilian activity done for income or pay: Secondary | ICD-10-CM | POA: Insufficient documentation

## 2021-01-21 DIAGNOSIS — X500XXA Overexertion from strenuous movement or load, initial encounter: Secondary | ICD-10-CM | POA: Insufficient documentation

## 2021-01-21 NOTE — ED Triage Notes (Signed)
Pt states mid back pain for two weeks that is worse with movement. Pt appears in no acute distress, denies shob, fever, cough. Pt asking for work note in triage.

## 2021-01-22 ENCOUNTER — Emergency Department
Admission: EM | Admit: 2021-01-22 | Discharge: 2021-01-22 | Disposition: A | Discharge status: Home / Self Care | Payer: Self-pay | Attending: Emergency Medicine | Admitting: Emergency Medicine

## 2021-01-22 DIAGNOSIS — M545 Low back pain, unspecified: Secondary | ICD-10-CM

## 2021-01-22 MED ORDER — PREDNISONE 10 MG PO TABS: 10.0000 mg | ORAL_TABLET | Freq: Every day | ORAL | 0 refills | Status: AC

## 2021-01-22 NOTE — ED Provider Notes (Signed)
[facility] Emergency Department Provider Note  Time seen: 4:47 AM  I have reviewed the triage vital signs and the nursing notes.   HISTORY  Chief Complaint Back Pain   HPI Carlos Evans is a 40 y.o. male with no significant past medical history presents to the emergency department for back pain.  According to the patient over the past 10 days or so he has been experiencing pain to his lower back.  Patient states he does do a lot of heavy lifting at work and believes he could have injured it while lifting.  Patient states the pain is mostly with movement especially when attempting to lift.  Patient states he cannot go to work on Sunday due to the back pain so he came to the emergency department for evaluation.  Here the patient appears well he is able to move fairly well.  Denies any weakness or numbness of the leg denies any incontinence.  Denies any sciatic symptoms.   No past medical history on file.  There are no problems to display for this patient.   No past surgical history on file.  Prior to Admission medications   Medication Sig Start Date End Date Taking? Authorizing Provider  predniSONE (DELTASONE) 10 MG tablet Take 1 tablet (10 mg total) by mouth daily. Day 1-3: take 4 tablets PO daily Day 4-6: take 3 tablets PO daily Day 7-9: take 2 tablets PO daily Day 10-12: take 1 tablet PO daily 01/22/21  Yes Minna Antis, MD  ibuprofen (ADVIL,MOTRIN) 800 MG tablet Take 1 tablet (800 mg total) by mouth every 8 (eight) hours as needed. 02/27/15   Beers, Charmayne Sheer, PA-C  naproxen (NAPROSYN) 500 MG tablet Take 1 tablet (500 mg total) by mouth 2 (two) times daily with a meal. 02/07/17   Triplett, Cari B, FNP  promethazine-dextromethorphan (PROMETHAZINE-DM) 6.25-15 MG/5ML syrup Take 5 mLs by mouth 4 (four) times daily as needed for cough. 09/07/18   Joni Reining, PA-C  sulfamethoxazole-trimethoprim (BACTRIM DS,SEPTRA DS) 800-160 MG tablet Take 1 tablet by mouth 2  (two) times daily. 02/07/17   Triplett, Rulon Eisenmenger B, FNP  traMADol (ULTRAM) 50 MG tablet Take 1 tablet (50 mg total) by mouth every 6 (six) hours as needed for moderate pain. 09/20/15   Joni Reining, PA-C    No Known Allergies  No family history on file.  Social History Social History   Tobacco Use   Smoking status: Some Days   Smokeless tobacco: Never  Substance Use Topics   Alcohol use: Yes   Drug use: No    Review of Systems Constitutional: Negative for fever. Cardiovascular: Negative for chest pain. Respiratory: Negative for shortness of breath. Gastrointestinal: Negative for abdominal pain Genitourinary: Negative for urinary compaints Musculoskeletal: Moderate midline lower back pain.  Dull aching pain. Neurological: Negative for headache All other ROS negative  ____________________________________________   PHYSICAL EXAM:  VITAL SIGNS: ED Triage Vitals [01/21/21 2203]  Enc Vitals Group     BP 117/80     Pulse Rate 100     Resp 16     Temp 98.4 F (36.9 C)     Temp Source Oral     SpO2 100 %     Weight 140 lb (63.5 kg)     Height 5\' 10"  (1.778 m)     Head Circumference      Peak Flow      Pain Score 8     Pain Loc  Pain Edu?      Excl. in GC?    Constitutional: Alert and oriented. Well appearing and in no distress. Eyes: Normal exam ENT      Head: Normocephalic and atraumatic.      Mouth/Throat: Mucous membranes are moist. Cardiovascular: Normal rate, regular rhythm Respiratory: Normal respiratory effort without tachypnea nor retractions. Breath sounds are clear  Gastrointestinal: Soft and nontender. No distention.   Musculoskeletal: Mild midline tenderness of the L-spine.  No paraspinal tenderness.  No step-offs or deformities.  No ecchymosis noted. Neurologic:  Normal speech and language. No gross focal neurologic deficits Skin:  Skin is warm, dry and intact.  Psychiatric: Mood and affect are normal.    ____________________________________________   INITIAL IMPRESSION / ASSESSMENT AND PLAN / ED COURSE  Pertinent labs & imaging results that were available during my care of the patient were reviewed by me and considered in my medical decision making (see chart for details).   Patient presents emergency department for lower back pain over the past 7 to 10 days.  Patient states heavy lifting at work which he believes is what caused or exacerbated the back pain.  Overall the patient appears well, no concerning findings.  No weakness or numbness no sciatica symptoms, no incontinence or fever.  We will place the patient on a prednisone taper I discussed using Tylenol or ibuprofen at home.  Patient agreeable to plan of care.  Discussed my typical back pain return precautions.  Carlos Evans was evaluated in Emergency Department on 01/22/2021 for the symptoms described in the history of present illness. He was evaluated in the context of the global COVID-19 pandemic, which necessitated consideration that the patient might be at risk for infection with the SARS-CoV-2 virus that causes COVID-19. Institutional protocols and algorithms that pertain to the evaluation of patients at risk for COVID-19 are in a state of rapid change based on information released by regulatory bodies including the CDC and federal and state organizations. These policies and algorithms were followed during the patient's care in the ED.  ____________________________________________   FINAL CLINICAL IMPRESSION(S) / ED DIAGNOSES  Back pain   Minna Antis, MD 01/22/21 [phone]

## 2022-03-23 ENCOUNTER — Emergency Department: Payer: Self-pay

## 2022-03-23 ENCOUNTER — Other Ambulatory Visit: Payer: Self-pay

## 2022-03-23 ENCOUNTER — Encounter: Payer: Self-pay | Admitting: Emergency Medicine

## 2022-03-23 DIAGNOSIS — Z0279 Encounter for issue of other medical certificate: Secondary | ICD-10-CM | POA: Insufficient documentation

## 2022-03-23 DIAGNOSIS — F10120 Alcohol abuse with intoxication, uncomplicated: Secondary | ICD-10-CM | POA: Diagnosis not present

## 2022-03-23 DIAGNOSIS — Y92410 Unspecified street and highway as the place of occurrence of the external cause: Secondary | ICD-10-CM | POA: Diagnosis not present
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
## 2022-03-24 ENCOUNTER — Emergency Department: Payer: Self-pay

## 2022-03-24 ENCOUNTER — Other Ambulatory Visit: Payer: Self-pay

## 2022-03-24 ENCOUNTER — Emergency Department
Admission: EM | Admit: 2022-03-24 | Discharge: 2022-03-24 | Disposition: A | Discharge status: Home / Self Care | Payer: Self-pay | Attending: Emergency Medicine | Admitting: Emergency Medicine

## 2022-03-24 NOTE — Discharge Instructions (Signed)
Use Tylenol for pain and fevers.  Up to 1000 mg per dose, up to 4 times per day.  Do not take more than 4000 mg of Tylenol/acetaminophen within 24 hours..  

## 2022-04-20 ENCOUNTER — Emergency Department
Admission: EM | Admit: 2022-04-20 | Discharge: 2022-04-20 | Disposition: A | Discharge status: Home / Self Care | Payer: Self-pay | Attending: Emergency Medicine | Admitting: Emergency Medicine

## 2022-04-20 ENCOUNTER — Other Ambulatory Visit: Payer: Self-pay

## 2022-04-20 ENCOUNTER — Emergency Department: Payer: Self-pay

## 2022-04-20 DIAGNOSIS — W109XXA Fall (on) (from) unspecified stairs and steps, initial encounter: Secondary | ICD-10-CM | POA: Insufficient documentation

## 2022-04-20 DIAGNOSIS — S2232XA Fracture of one rib, left side, initial encounter for closed fracture: Secondary | ICD-10-CM | POA: Insufficient documentation

## 2022-04-20 DIAGNOSIS — W19XXXA Unspecified fall, initial encounter: Secondary | ICD-10-CM

## 2022-04-20 MED ORDER — OXYCODONE-ACETAMINOPHEN 5-325 MG PO TABS: 1.0000 | ORAL_TABLET | Freq: Four times a day (QID) | ORAL | 0 refills | Status: AC | PRN

## 2022-04-20 NOTE — ED Triage Notes (Signed)
Pt arrives with c/o left sided rib pain. Pt had a fall and landed on his ribs. Pt ambulatory in triage.

## 2022-04-20 NOTE — Discharge Instructions (Addendum)
Today in the emergency room after a fall.  You have had a x-ray and it has revealed that you have 1 rib fracture.  I discussed with you the importance of taking deep breaths.  I have also discussed the importance of using a pillow to splint when coughing.  I will provide you a short course of pain medication.  You should follow-up with [facility] clinic urgent care if symptoms persist or worsen.

## 2022-04-20 NOTE — ED Provider Notes (Signed)
[facility] Emergency Department Provider Note   ____________________________________________   Event Date/Time   First MD Initiated Contact with Patient 04/20/22 1844     (approximate)  I have reviewed the triage vital signs and the nursing notes.   HISTORY  Chief Complaint Rib Cage Pain     HPI Carlos Evans is a 41 y.o. male Modena Jansky to the emergency room after a fall.  He reports that he fell 2 days ago.  He reports that he fell down 5 stairs landing on his left side.  Patient reports that he thought that he would be fine however the pain got worse and he decided he would come to the emergency room.  Patient denies any chest pain, shortness of breath, dyspnea on exertion.  However.  He does complain of pain with deep inspiration.  Patient is not taking anything for the pain at this time.  He describes the pain as a deep aching sensation and approximately a 4 out of 10.   History reviewed. No pertinent past medical history.  There are no problems to display for this patient.   History reviewed. No pertinent surgical history.  Prior to Admission medications   Medication Sig Start Date End Date Taking? Authorizing Provider  oxyCODONE-acetaminophen (PERCOCET) 5-325 MG tablet Take 1 tablet by mouth every 6 (six) hours as needed for up to 3 days for severe pain. 04/20/22 04/23/22 Yes Herschell Dimes, NP  ibuprofen (ADVIL,MOTRIN) 800 MG tablet Take 1 tablet (800 mg total) by mouth every 8 (eight) hours as needed. 02/27/15   Beers, Charmayne Sheer, PA-C  naproxen (NAPROSYN) 500 MG tablet Take 1 tablet (500 mg total) by mouth 2 (two) times daily with a meal. 02/07/17   Triplett, Cari B, FNP  predniSONE (DELTASONE) 10 MG tablet Take 1 tablet (10 mg total) by mouth daily. Day 1-3: take 4 tablets PO daily Day 4-6: take 3 tablets PO daily Day 7-9: take 2 tablets PO daily Day 10-12: take 1 tablet PO daily 01/22/21   Minna Antis, MD  promethazine-dextromethorphan  (PROMETHAZINE-DM) 6.25-15 MG/5ML syrup Take 5 mLs by mouth 4 (four) times daily as needed for cough. 09/07/18   Joni Reining, PA-C  sulfamethoxazole-trimethoprim (BACTRIM DS,SEPTRA DS) 800-160 MG tablet Take 1 tablet by mouth 2 (two) times daily. 02/07/17   Triplett, Rulon Eisenmenger B, FNP  traMADol (ULTRAM) 50 MG tablet Take 1 tablet (50 mg total) by mouth every 6 (six) hours as needed for moderate pain. 09/20/15   Joni Reining, PA-C    Allergies Patient has no known allergies.  No family history on file.  Social History Social History   Tobacco Use   Smokeless tobacco: Never  Substance Use Topics   Alcohol use: Yes   Drug use: No    Review of Systems  Constitutional: No fever/chills Eyes: No visual changes. ENT: No sore throat. Cardiovascular: Denies chest pain. Respiratory: Denies shortness of breath. Gastrointestinal: No abdominal pain.  No nausea, no vomiting.  No diarrhea.  No constipation. Genitourinary: Negative for dysuria. Musculoskeletal: Left-sided rib cage pain. Skin: Negative for rash. Neurological: Negative for headaches, focal weakness or numbness.   ____________________________________________   PHYSICAL EXAM:  VITAL SIGNS: ED Triage Vitals [04/20/22 1829]  Enc Vitals Group     BP (!) 140/88     Pulse Rate 89     Resp 16     Temp 98.6 F (37 C)     Temp Source Oral     SpO2  100 %     Weight 140 lb (63.5 kg)     Height      Head Circumference      Peak Flow      Pain Score 8     Pain Loc      Pain Edu?      Excl. in [place]?     Constitutional: Alert and oriented. Well appearing and in no acute distress. Eyes: Conjunctivae are normal. PERRL. EOMI. Head: Atraumatic. Nose: No congestion/rhinnorhea. Mouth/Throat: Mucous membranes are moist.  Oropharynx non-erythematous. Neck: No stridor.   Cardiovascular: Normal rate, regular rhythm. Grossly normal heart sounds.  Good peripheral circulation. Respiratory: Normal respiratory effort.  No retractions.  Lungs CTAB. Gastrointestinal: Soft and nontender. No distention. No abdominal bruits. No CVA tenderness. Musculoskeletal: No lower extremity tenderness nor edema.  No joint effusions.  No tenderness to palpation over the left rib cage.  No bruising/redness/swelling noted over the left rib cage.  However, patient does have/endorse pain with deep inspiration.  Patient reports that pain is worse when he coughs.  There is no obvious abnormality noted on visualization of the rib cage. Neurologic:  Normal speech and language. No gross focal neurologic deficits are appreciated. No gait instability. Skin:  Skin is warm, dry and intact. No rash noted. Psychiatric: Mood and affect are normal. Speech and behavior are normal.  ____________________________________________   LABS (all labs ordered are listed, but only abnormal results are displayed)  Labs Reviewed - No data to display ____________________________________________  EKG   ____________________________________________  RADIOLOGY  ED MD interpretation: X-ray of chest was reviewed by me and read by radiologist.  Official radiology report(s): DG Ribs Unilateral W/Chest Left  Result Date: 04/20/2022 CLINICAL DATA:  Anterior and axillary left rib pain since a fall 1 week ago. EXAM: LEFT RIBS AND CHEST - 3+ VIEW COMPARISON:  Chest dated 05/18/2009. FINDINGS: Normal sized heart. Clear lungs. Mildly displaced left lateral 9th rib fracture. No pneumothorax. IMPRESSION: Mildly displaced left lateral 9th rib fracture. Electronically Signed   By: Claudie Revering M.D.   On: 04/20/2022 19:10    ____________________________________________   PROCEDURES  Procedure(s) performed: None  Procedures  Critical Care performed: No  ____________________________________________   INITIAL IMPRESSION / ASSESSMENT AND PLAN / ED COURSE    Carlos Evans is a 41 y.o. male [name] to the emergency room after a fall.  He reports that he fell 2 days ago.   He reports that he fell down 5 stairs landing on his left side.  Patient reports that he thought that he would be fine however the pain got worse and he decided he would come to the emergency room.  Patient denies any chest pain, shortness of breath, dyspnea on exertion.  However.  He does complain of pain with deep inspiration.  Patient is not taking anything for the pain at this time.  He describes the pain as a deep aching sensation and approximately a 4 out of 10.   Chest x-ray obtained. Chest x-ray shows mildly displaced left lateral ninth rib fracture. Discussed findings with patient. I discussed with patient the importance of taking deep breaths periodically throughout the day.  I also discussed with patient splinting with pillow for coughing. We will provide patient with 3-day course of pain medication.  Patient is in no acute distress at this time and will be discharged home in stable condition.      ____________________________________________   FINAL CLINICAL IMPRESSION(S) / ED DIAGNOSES  Final diagnoses:  Closed fracture of one rib of left side, initial encounter  Fall, initial encounter     ED Discharge Orders          Ordered    oxyCODONE-acetaminophen (PERCOCET) 5-325 MG tablet  Every 6 hours PRN        04/20/22 1933             Note:  This document was prepared using Dragon voice recognition software and may include unintentional dictation errors.     Herschell Dimes, NP 04/20/22 1933    Jene Every, MD 04/22/22 [phone]

## 2022-09-28 ENCOUNTER — Emergency Department
Admission: EM | Admit: 2022-09-28 | Discharge: 2022-09-28 | Disposition: A | Discharge status: Home / Self Care | Payer: Self-pay | Attending: Emergency Medicine | Admitting: Emergency Medicine

## 2022-09-28 DIAGNOSIS — Z202 Contact with and (suspected) exposure to infections with a predominantly sexual mode of transmission: Secondary | ICD-10-CM | POA: Insufficient documentation

## 2022-09-28 LAB — CHLAMYDIA/NGC RT PCR (ARMC ONLY)
Chlamydia Tr: NOT DETECTED
N gonorrhoeae: NOT DETECTED

## 2022-09-28 MED ORDER — CEFTRIAXONE SODIUM 250 MG IJ SOLR
250.0000 mg | Freq: Once | INTRAMUSCULAR | Status: AC
  Administered 2022-09-28: 250 mg via INTRAMUSCULAR
  Filled 2022-09-28: qty 250

## 2022-09-28 MED ORDER — AZITHROMYCIN 500 MG PO TABS
1000.0000 mg | ORAL_TABLET | Freq: Once | ORAL | Status: AC
  Administered 2022-09-28: 1000 mg via ORAL
  Filled 2022-09-28: qty 2

## 2022-09-28 NOTE — ED Provider Notes (Signed)
   [facility] Provider Note    Event Date/Time   First MD Initiated Contact with Patient 09/28/22 1455     (approximate)  History   Chief Complaint: STI check  HPI  Carlos Evans is a 42 y.o. male with no past medical history who presents to the emergency department for concern over a STD.  According to the patient his girlfriend was just recently diagnosed with a pelvic infection but he is not sure which type.  Patient denies any urinary symptoms.  Denies any penile discharge lumps or bumps.  Patient has noted some slight tenderness to the glans of the penis at times.  Physical Exam   Triage Vital Signs: ED Triage Vitals  Enc Vitals Group     BP 09/28/22 1427 119/79     Pulse Rate 09/28/22 1427 80     Resp 09/28/22 1427 15     Temp 09/28/22 1427 98.6 F (37 C)     Temp Source 09/28/22 1427 Oral     SpO2 09/28/22 1427 99 %     Weight 09/28/22 1428 140 lb (63.5 kg)     Height 09/28/22 1428 5\' 10"  (1.778 m)     Head Circumference --      Peak Flow --      Pain Score 09/28/22 1428 0     Pain Loc --      Pain Edu? --      Excl. in [place]? --     Most recent vital signs: Vitals:   09/28/22 1427  BP: 119/79  Pulse: 80  Resp: 15  Temp: 98.6 F (37 C)  SpO2: 99%    General: Awake, no distress.  CV:  Good peripheral perfusion.   Resp:  Normal effort.   Abd:  No distention.  Soft, nontender.  Other:  Normal external GU exam.  No penile discharge no lesions noted.   ED Results / Procedures / Treatments   MEDICATIONS ORDERED IN ED: Medications - No data to display   IMPRESSION / MDM / ASSESSMENT AND PLAN / ED COURSE  I reviewed the triage vital signs and the nursing notes.  Patient's presentation is most consistent with acute illness / injury with system symptoms.  Patient presents emergency department for evaluation of possible sexually transmitted disease.  States girlfriend is recently diagnosed with a "pelvic infection" but does not  know what type of infection but that she had recommended that he go get checked.  Patient denies any STI symptoms.  We will send a urinalysis for gonorrhea/chlamydia.  We will treat with Rocephin and Zithromax in the emergency department.  Patient states he will follow-up on MyChart for test results.  FINAL CLINICAL IMPRESSION(S) / ED DIAGNOSES   STD exposure  Note:  This document was prepared using Dragon voice recognition software and may include unintentional dictation errors.   Minna Antis, MD 09/28/22 [phone]

## 2022-09-28 NOTE — ED Triage Notes (Signed)
Pt to ED for STI check, states girlfriend tested positive last month for "some sort of bacterial infection". Denies urinary pain or abnormal discharge but states sometimes head of penis is tender.

## 2023-12-18 ENCOUNTER — Emergency Department
Admission: EM | Admit: 2023-12-18 | Discharge: 2023-12-18 | Disposition: A | Discharge status: Home / Self Care | Attending: Emergency Medicine | Admitting: Emergency Medicine

## 2023-12-18 ENCOUNTER — Other Ambulatory Visit: Payer: Self-pay

## 2023-12-18 DIAGNOSIS — S161XXA Strain of muscle, fascia and tendon at neck level, initial encounter: Secondary | ICD-10-CM | POA: Insufficient documentation

## 2023-12-18 DIAGNOSIS — S39012A Strain of muscle, fascia and tendon of lower back, initial encounter: Secondary | ICD-10-CM | POA: Insufficient documentation

## 2023-12-18 DIAGNOSIS — Y92410 Unspecified street and highway as the place of occurrence of the external cause: Secondary | ICD-10-CM | POA: Diagnosis not present

## 2023-12-18 DIAGNOSIS — M545 Low back pain, unspecified: Secondary | ICD-10-CM | POA: Diagnosis present

## 2023-12-18 MED ORDER — CYCLOBENZAPRINE HCL 5 MG PO TABS: 5.0000 mg | ORAL_TABLET | Freq: Three times a day (TID) | ORAL | 0 refills | Status: AC | PRN

## 2023-12-18 NOTE — Discharge Instructions (Addendum)
 Your evaluated in the ED.  Your physical exam findings are normal and reassuring of any acute or life-threatening injuries or illnesses.  Please get plenty of rest.  Alternate Tylenol  and ibuprofen  for pain as needed.  Follow-up with your primary care provider as needed.  If symptoms worsen return to ED for further evaluation.
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
